# Patient Record
Sex: Female | Born: 1970 | Race: White | Hispanic: No | Smoking: Former smoker
Health system: Southern US, Community
[De-identification: ages and names within clinical notes are randomized; demographics above are authoritative.]

## PROBLEM LIST (undated history)

## (undated) DIAGNOSIS — K219 Gastro-esophageal reflux disease without esophagitis: Secondary | ICD-10-CM

---

## 2001-08-10 ENCOUNTER — Encounter: Payer: Self-pay | Admitting: Obstetrics & Gynecology

## 2001-08-10 ENCOUNTER — Encounter: Admission: RE | Admit: 2001-08-10 | Discharge: 2001-08-10 | Payer: Self-pay | Admitting: Obstetrics & Gynecology

## 2007-11-02 ENCOUNTER — Encounter: Admission: RE | Admit: 2007-11-02 | Discharge: 2007-11-02 | Payer: Self-pay | Admitting: Obstetrics and Gynecology

## 2013-02-22 ENCOUNTER — Other Ambulatory Visit: Payer: Self-pay

## 2013-02-22 DIAGNOSIS — Z1231 Encounter for screening mammogram for malignant neoplasm of breast: Secondary | ICD-10-CM

## 2013-03-24 ENCOUNTER — Ambulatory Visit
Admission: RE | Admit: 2013-03-24 | Discharge: 2013-03-24 | Disposition: A | Discharge status: Home / Self Care | Payer: BC Managed Care – PPO | Source: Ambulatory Visit

## 2013-03-24 DIAGNOSIS — Z1231 Encounter for screening mammogram for malignant neoplasm of breast: Secondary | ICD-10-CM

## 2013-03-27 ENCOUNTER — Other Ambulatory Visit: Payer: Self-pay | Admitting: Obstetrics and Gynecology

## 2013-03-27 DIAGNOSIS — R928 Other abnormal and inconclusive findings on diagnostic imaging of breast: Secondary | ICD-10-CM

## 2013-04-10 ENCOUNTER — Ambulatory Visit
Admission: RE | Admit: 2013-04-10 | Discharge: 2013-04-10 | Disposition: A | Discharge status: Home / Self Care | Payer: BC Managed Care – PPO | Source: Ambulatory Visit | Attending: Obstetrics and Gynecology | Admitting: Obstetrics and Gynecology

## 2013-04-10 DIAGNOSIS — R928 Other abnormal and inconclusive findings on diagnostic imaging of breast: Secondary | ICD-10-CM

## 2013-06-14 ENCOUNTER — Ambulatory Visit (INDEPENDENT_AMBULATORY_CARE_PROVIDER_SITE_OTHER): Payer: BC Managed Care – PPO | Admitting: Family Medicine

## 2013-06-14 VITALS — BP 120/88 | HR 78 | Temp 98.2°F | Resp 18 | Ht 64.0 in | Wt 123.8 lb

## 2013-06-14 DIAGNOSIS — R197 Diarrhea, unspecified: Secondary | ICD-10-CM

## 2013-06-14 LAB — POCT CBC
Granulocyte percent: 51.5 %G (ref 37–80)
HCT, POC: 45 % (ref 37.7–47.9)
MCH, POC: 30.8 pg (ref 27–31.2)
MCV: 96.8 fL (ref 80–97)
MID (cbc): 0.6 (ref 0–0.9)
POC LYMPH PERCENT: 40.5 %L (ref 10–50)
RBC: 4.65 M/uL (ref 4.04–5.48)
WBC: 7.4 10*3/uL (ref 4.6–10.2)

## 2013-06-14 LAB — IFOBT (OCCULT BLOOD): IFOBT: NEGATIVE

## 2013-06-14 MED ORDER — CIPROFLOXACIN HCL 500 MG PO TABS: 500.0000 mg | ORAL_TABLET | Freq: Two times a day (BID) | ORAL | Status: DC

## 2013-06-14 NOTE — Progress Notes (Addendum)
[facility] [address], [place] [address] [phone]  Date:  06/14/2013   Name:  Rachel Peterson   DOB:  January 03, 1971   MRN:  [id number]  PCP:  No PCP Per Patient    Chief Complaint: Diarrhea   History of Present Illness:  Rachel Peterson is a 42 y.o. very pleasant female patient who presents with the following:  She is here with diarrhea- on June 13th/ 14th she was ill with "either food poisoning or a virus."  She had nausea and vomiting (twice) and diarrhea at that time.  She now feels better overall but the diarrhea has persisted.  Her sx will wax and wane.  She had 4 stools today, but 12 on Saturday.  No further vomiting.  She is eating normally and tolerating liquids She did see a orange stool twice, but has not noted any red blood.  The stool is watery, and she will have some loose "powdery" stool as well.  She may have a little bit of cramping prior to a BM, but no other abdominal pains.    Prior to getting sick she ate out.  She had a hamburger which was medium well.  She did not eat much of the burger.   She is generally quite healthy, LMP about 2 weeks ago.   She has not recently been on any abx- she has not taken any abx in several months.   She uses melatonin for sleep.  She does drink some coffee and takes energy pills, but these are not new. She does not take any other medications   There are no active problems to display for this patient.   History reviewed. No pertinent past medical history.  History reviewed. No pertinent past surgical history.  History  Substance Use Topics  . Smoking status: Former Smoker -- 20 years    Types: Cigarettes  . Smokeless tobacco: Not on file  . Alcohol Use: Yes    History reviewed. No pertinent family history.  No Known Allergies  Medication list has been reviewed and updated.  No current outpatient prescriptions on file prior to visit.   No current facility-administered medications on file prior  to visit.    Review of Systems:  As per HPI- otherwise negative.   Physical Examination: Filed Vitals:   06/14/13 1808  BP: 141/99  Pulse: 78  Temp: 98.2 F (36.8 C)  Resp: 18   Filed Vitals:   06/14/13 1808  Height: 5\' 4"  (1.626 m)  Weight: 123 lb 12.8 oz (56.155 kg)   Body mass index is 21.24 kg/(m^2). Ideal Body Weight: Weight in (lb) to have BMI = 25: 145.3  GEN: WDWN, NAD, Non-toxic, A & O x 3, looks well HEENT: Atraumatic, Normocephalic. Neck supple. No masses, No LAD. Ears and Nose: No external deformity. CV: RRR, No M/G/R. No JVD. No thrill. No extra heart sounds. PULM: CTA B, no wheezes, crackles, rhonchi. No retractions. No resp. distress. No accessory muscle use. ABD: S, NT, ND, +BS. No rebound. No HSM. Benign exam EXTR: No c/c/e NEURO Normal gait.  PSYCH: Normally interactive. Conversant. Not depressed or anxious appearing.  Calm demeanor.   Results for orders placed in visit on 06/14/13  POCT CBC      Result Value Range   WBC 7.4  4.6 - 10.2 K/uL   Lymph, poc 3.0  0.6 - 3.4   POC LYMPH PERCENT 40.5  10 - 50 %L   MID (cbc)  0.6  0 - 0.9   POC MID % 8.0  0 - 12 %M   POC Granulocyte 3.8  2 - 6.9   Granulocyte percent 51.5  37 - 80 %G   RBC 4.65  4.04 - 5.48 M/uL   Hemoglobin 14.3  12.2 - 16.2 g/dL   HCT, POC 16.1  09.6 - 47.9 %   MCV 96.8  80 - 97 fL   MCH, POC 30.8  27 - 31.2 pg   MCHC 31.8  31.8 - 35.4 g/dL   RDW, POC 04.5     Platelet Count, POC 367  142 - 424 K/uL   MPV 10.0  0 - 99.8 fL  IFOBT (OCCULT BLOOD)      Result Value Range   IFOBT Negative      Assessment and Plan: Diarrhea - Plan: Ova and parasite screen, Stool culture, POCT CBC, Comprehensive metabolic panel, IFOBT POC (occult bld, rslt in office), ciprofloxacin (CIPRO) 500 MG tablet  Persistent diarrhea.  Suspect she may have had a food borne illness.  Will treat with cipro for 3- 5 days depending on her symptoms. Await other labs as above, and she will collect a stool sample for  analysis.   Signed Abbe Amsterdam, MD  7/4- called to check on her CMP results normal  Results for orders placed in visit on 06/14/13  COMPREHENSIVE METABOLIC PANEL      Result Value Range   Sodium 139  135 - 145 mEq/L   Potassium 3.9  3.5 - 5.3 mEq/L   Chloride 106  96 - 112 mEq/L   CO2 21  19 - 32 mEq/L   Glucose, Bld 98  70 - 99 mg/dL   BUN 13  6 - 23 mg/dL   Creat 4.09  8.11 - 9.14 mg/dL   Total Bilirubin 0.9  0.3 - 1.2 mg/dL   Alkaline Phosphatase 56  39 - 117 U/L   AST 13  0 - 37 U/L   ALT 13  0 - 35 U/L   Total Protein 6.9  6.0 - 8.3 g/dL   Albumin 4.5  3.5 - 5.2 g/dL   Calcium 9.5  8.4 - 78.2 mg/dL  POCT CBC      Result Value Range   WBC 7.4  4.6 - 10.2 K/uL   Lymph, poc 3.0  0.6 - 3.4   POC LYMPH PERCENT 40.5  10 - 50 %L   MID (cbc) 0.6  0 - 0.9   POC MID % 8.0  0 - 12 %M   POC Granulocyte 3.8  2 - 6.9   Granulocyte percent 51.5  37 - 80 %G   RBC 4.65  4.04 - 5.48 M/uL   Hemoglobin 14.3  12.2 - 16.2 g/dL   HCT, POC 95.6  21.3 - 47.9 %   MCV 96.8  80 - 97 fL   MCH, POC 30.8  27 - 31.2 pg   MCHC 31.8  31.8 - 35.4 g/dL   RDW, POC 08.6     Platelet Count, POC 367  142 - 424 K/uL   MPV 10.0  0 - 99.8 fL  IFOBT (OCCULT BLOOD)      Result Value Range   IFOBT Negative     She has actually not picked up the cipro yet.  Her diarrhea is a little better but still there.  She will plan to start the cipro in the next day or so.  She has dropped off the stool sample and  I will look out for this result.    7/7- received stool culture.  Called to let her know it was negative, and stool O and P negative as well.  She will let me know if not better after the cipro, she just started it today

## 2013-06-14 NOTE — Patient Instructions (Addendum)
Let me know if you are not better in the next few days- sooner if you are getting worse!  I will be in touch with your labs as soon as they come in.

## 2013-06-15 LAB — COMPREHENSIVE METABOLIC PANEL
Albumin: 4.5 g/dL (ref 3.5–5.2)
BUN: 13 mg/dL (ref 6–23)
Calcium: 9.5 mg/dL (ref 8.4–10.5)
Chloride: 106 mEq/L (ref 96–112)
Creat: 0.8 mg/dL (ref 0.50–1.10)
Glucose, Bld: 98 mg/dL (ref 70–99)
Potassium: 3.9 mEq/L (ref 3.5–5.3)

## 2013-06-19 LAB — STOOL CULTURE

## 2013-07-14 ENCOUNTER — Telehealth: Payer: Self-pay

## 2013-07-14 DIAGNOSIS — R197 Diarrhea, unspecified: Secondary | ICD-10-CM

## 2013-07-14 NOTE — Telephone Encounter (Signed)
Patient calling to speak to Dr. Patsy Lager, she wanted to ask her  what's the next step because she is still having issues with diarrhea.   Best number is: [phone]

## 2013-07-16 NOTE — Telephone Encounter (Signed)
Called her back- no answer but LMOM.  If severe diarrhea persists please come back. I will refer her to GI for further evaluation otherwise.

## 2013-09-07 ENCOUNTER — Encounter: Payer: Self-pay | Admitting: Family Medicine

## 2014-01-01 ENCOUNTER — Ambulatory Visit (INDEPENDENT_AMBULATORY_CARE_PROVIDER_SITE_OTHER): Payer: Commercial Managed Care - PPO | Admitting: Emergency Medicine

## 2014-01-01 VITALS — BP 135/90 | HR 63 | Temp 98.4°F | Resp 16 | Ht 63.0 in | Wt 119.0 lb

## 2014-01-01 DIAGNOSIS — S0180XA Unspecified open wound of other part of head, initial encounter: Secondary | ICD-10-CM

## 2014-01-01 DIAGNOSIS — S01131A Puncture wound without foreign body of right eyelid and periocular area, initial encounter: Secondary | ICD-10-CM

## 2014-01-01 NOTE — Progress Notes (Signed)
   Subjective:    Patient ID: Rachel BeechamJulie S Peterson, female    DOB: 04/09/1971, 43 y.o.   MRN: [id number]  HPI Patient is here because she poked herself with an old antique apple peeler on her right eyebrow No pain no swelling no redness Doesn't know when she had her last tetanus    Review of Systems     Objective:   Physical Exam There is a tiny 2 mm puncture area mid right brow the eye itself is normal to exam pupils are equal and reactive. There is no evidence of any injury to the eye itself.       Assessment & Plan:  Patient here with a puncture wound to the right eyebrow. Tdap will be given.

## 2014-05-21 ENCOUNTER — Other Ambulatory Visit: Payer: Self-pay

## 2014-05-21 DIAGNOSIS — Z1231 Encounter for screening mammogram for malignant neoplasm of breast: Secondary | ICD-10-CM

## 2014-06-04 ENCOUNTER — Ambulatory Visit
Admission: RE | Admit: 2014-06-04 | Discharge: 2014-06-04 | Disposition: A | Discharge status: Home / Self Care | Payer: BC Managed Care – PPO | Source: Ambulatory Visit

## 2014-06-04 ENCOUNTER — Encounter (INDEPENDENT_AMBULATORY_CARE_PROVIDER_SITE_OTHER): Payer: Self-pay

## 2014-06-04 DIAGNOSIS — Z1231 Encounter for screening mammogram for malignant neoplasm of breast: Secondary | ICD-10-CM

## 2014-07-04 ENCOUNTER — Ambulatory Visit (INDEPENDENT_AMBULATORY_CARE_PROVIDER_SITE_OTHER): Payer: Commercial Managed Care - PPO

## 2014-07-04 ENCOUNTER — Ambulatory Visit (INDEPENDENT_AMBULATORY_CARE_PROVIDER_SITE_OTHER): Payer: Commercial Managed Care - PPO | Admitting: Family Medicine

## 2014-07-04 VITALS — BP 116/74 | HR 88 | Temp 97.6°F | Resp 16 | Ht 63.0 in | Wt 118.4 lb

## 2014-07-04 DIAGNOSIS — R109 Unspecified abdominal pain: Secondary | ICD-10-CM

## 2014-07-04 LAB — POCT CBC
GRANULOCYTE PERCENT: 74.7 % (ref 37–80)
HCT, POC: 48 % — AB (ref 37.7–47.9)
Hemoglobin: 15.8 g/dL (ref 12.2–16.2)
Lymph, poc: 1.9 (ref 0.6–3.4)
MCH, POC: 30.6 pg (ref 27–31.2)
MCHC: 32.8 g/dL (ref 31.8–35.4)
MCV: 93.3 fL (ref 80–97)
MID (CBC): 0.3 (ref 0–0.9)
MPV: 7.7 fL (ref 0–99.8)
POC GRANULOCYTE: 6.5 (ref 2–6.9)
POC LYMPH PERCENT: 22.1 %L (ref 10–50)
POC MID %: 3.2 % (ref 0–12)
Platelet Count, POC: 315 10*3/uL (ref 142–424)
RBC: 5.15 M/uL (ref 4.04–5.48)
RDW, POC: 13.4 %
WBC: 8.7 10*3/uL (ref 4.6–10.2)

## 2014-07-04 LAB — COMPREHENSIVE METABOLIC PANEL
ALT: 13 U/L (ref 0–35)
AST: 14 U/L (ref 0–37)
Albumin: 4.4 g/dL (ref 3.5–5.2)
Alkaline Phosphatase: 69 U/L (ref 39–117)
BILIRUBIN TOTAL: 1 mg/dL (ref 0.2–1.2)
BUN: 11 mg/dL (ref 6–23)
CO2: 29 meq/L (ref 19–32)
Calcium: 9.4 mg/dL (ref 8.4–10.5)
Chloride: 103 mEq/L (ref 96–112)
Creat: 0.98 mg/dL (ref 0.50–1.10)
GLUCOSE: 85 mg/dL (ref 70–99)
Potassium: 4.2 mEq/L (ref 3.5–5.3)
SODIUM: 142 meq/L (ref 135–145)
TOTAL PROTEIN: 7.2 g/dL (ref 6.0–8.3)

## 2014-07-04 LAB — POCT URINE PREGNANCY: PREG TEST UR: NEGATIVE

## 2014-07-04 NOTE — Progress Notes (Addendum)
[facility] [address] [place] [address] [phone]- 0000  Date:  07/04/2014   Name:  Rachel Peterson   DOB:  [birth year]   MRN:  [id number]  PCP:  No PCP Per Patient    Chief Complaint: Abdominal Pain   History of Present Illness:  Rachel Peterson is a 43 y.o. very pleasant female patient who presents with the following:  Yesterday around noon she noted abdominal pain.  She felt like she needed to pass gas but could not. She used some milk of magnesia- she did have a few stools and felt better.  Then she ate a salad for dinner, the pain returned around 11pm last night.  She had pain for most of the night and did not sleep well Right now she feels ok, her stomach is not 100%  No nausea, no vomiting.  She did have 2 stools this am. No fever, body aches.    She had diarrhea for a few weeks last year- she was referred to GI but her sx went away so she did not go.  She wonders if she might have IBS- she tends to have mild constipation and will feel better after she has a BM.  She may use milk of mag once a week or so  LMP at the start of the month.   She ate oatmeal this am- so far she is feeling ok after eating.   There are no active problems to display for this patient.   History reviewed. No pertinent past medical history.  History reviewed. No pertinent past surgical history.  History  Substance Use Topics  . Smoking status: Former Smoker -- 20 years    Types: Cigarettes  . Smokeless tobacco: Not on file  . Alcohol Use: Yes    Family History  Problem Relation Age of Onset  . Diabetes Father   . Hypertension Father     No Known Allergies  Medication list has been reviewed and updated.  No current outpatient prescriptions on file prior to visit.   No current facility-administered medications on file prior to visit.    Review of Systems:  As per HPI- otherwise negative.   Physical Examination: Filed Vitals:   07/04/14 1138  BP: 116/74   Pulse: 88  Temp: 97.6 F (36.4 C)  Resp: 16   Filed Vitals:   07/04/14 1138  Height: 5\' 3"  (1.6 m)  Weight: 118 lb 6.4 oz (53.706 kg)   Body mass index is 20.98 kg/(m^2). Ideal Body Weight: Weight in (lb) to have BMI = 25: 140.8  GEN: WDWN, NAD, Non-toxic, A & O x 3 HEENT: Atraumatic, Normocephalic. Neck supple. No masses, No LAD. Ears and Nose: No external deformity. CV: RRR, No M/G/R. No JVD. No thrill. No extra heart sounds. PULM: CTA B, no wheezes, crackles, rhonchi. No retractions. No resp. distress. No accessory muscle use. ABD: S, NT, ND, +BS. No rebound. No HSM.  Abdomen is benign EXTR: No c/c/e NEURO Normal gait.  PSYCH: Normally interactive. Conversant. Not depressed or anxious appearing.  Calm demeanor.   Results for orders placed in visit on 07/04/14  POCT CBC      Result Value Ref Range   WBC 8.7  4.6 - 10.2 K/uL   Lymph, poc 1.9  0.6 - 3.4   POC LYMPH PERCENT 22.1  10 - 50 %L   MID (cbc) 0.3  0 - 0.9   POC MID % 3.2  0 - 12 %  M   POC Granulocyte 6.5  2 - 6.9   Granulocyte percent 74.7  37 - 80 %G   RBC 5.15  4.04 - 5.48 M/uL   Hemoglobin 15.8  12.2 - 16.2 g/dL   HCT, POC 16.148.0 (*) 09.637.7 - 47.9 %   MCV 93.3  80 - 97 fL   MCH, POC 30.6  27 - 31.2 pg   MCHC 32.8  31.8 - 35.4 g/dL   RDW, POC 04.513.4     Platelet Count, POC 315  142 - 424 K/uL   MPV 7.7  0 - 99.8 fL  POCT URINE PREGNANCY      Result Value Ref Range   Preg Test, Ur Negative     [facility] reading (PRIMARY) by  Dr. Patsy Lageropland. abd series: non- specific bowel gas pattern.  Some increased stool and gas  ABDOMEN - 2 VIEW  COMPARISON: None.  FINDINGS: Normal bowel gas pattern. No dilated loops of large or small bowel. No pathologic air-fluid levels. No free air is identified.  IMPRESSION: Negative.  Assessment and Plan: Abdominal pain, unspecified site - Plan: POCT CBC, Comprehensive metabolic panel, POCT urine pregnancy, DG Abd 2 Views  Abdominal pain yesterday which is now resolved.  Discussed a  CT scan- as her sx are currently quiet she would like to defer any further testing.   Await labs, bland diet.  She will seek care if severe pain comes back   Signed Abbe AmsterdamJessica Tymara Saur, MD  7/23: Called to go over her labs Results for orders placed in visit on 07/04/14  COMPREHENSIVE METABOLIC PANEL      Result Value Ref Range   Sodium 142  135 - 145 mEq/L   Potassium 4.2  3.5 - 5.3 mEq/L   Chloride 103  96 - 112 mEq/L   CO2 29  19 - 32 mEq/L   Glucose, Bld 85  70 - 99 mg/dL   BUN 11  6 - 23 mg/dL   Creat 4.090.98  8.110.50 - 9.141.10 mg/dL   Total Bilirubin 1.0  0.2 - 1.2 mg/dL   Alkaline Phosphatase 69  39 - 117 U/L   AST 14  0 - 37 U/L   ALT 13  0 - 35 U/L   Total Protein 7.2  6.0 - 8.3 g/dL   Albumin 4.4  3.5 - 5.2 g/dL   Calcium 9.4  8.4 - 78.210.5 mg/dL  POCT CBC      Result Value Ref Range   WBC 8.7  4.6 - 10.2 K/uL   Lymph, poc 1.9  0.6 - 3.4   POC LYMPH PERCENT 22.1  10 - 50 %L   MID (cbc) 0.3  0 - 0.9   POC MID % 3.2  0 - 12 %M   POC Granulocyte 6.5  2 - 6.9   Granulocyte percent 74.7  37 - 80 %G   RBC 5.15  4.04 - 5.48 M/uL   Hemoglobin 15.8  12.2 - 16.2 g/dL   HCT, POC 95.648.0 (*) 21.337.7 - 47.9 %   MCV 93.3  80 - 97 fL   MCH, POC 30.6  27 - 31.2 pg   MCHC 32.8  31.8 - 35.4 g/dL   RDW, POC 08.613.4     Platelet Count, POC 315  142 - 424 K/uL   MPV 7.7  0 - 99.8 fL  POCT URINE PREGNANCY      Result Value Ref Range   Preg Test, Ur Negative     LMOM- labs are  normal.  Let me know if not continuing to feel well.  Her blood counts are at the upper limit of normal- would be a good time to consider donating blood if she is so inclined

## 2014-07-04 NOTE — Patient Instructions (Signed)
Let me know if your pain returns, and I will be in touch with your labs.

## 2014-07-05 ENCOUNTER — Encounter: Payer: Self-pay | Admitting: Family Medicine

## 2014-07-18 ENCOUNTER — Telehealth: Payer: Self-pay

## 2014-07-18 DIAGNOSIS — K5900 Constipation, unspecified: Secondary | ICD-10-CM

## 2014-07-18 NOTE — Telephone Encounter (Signed)
Patient requesting to speak directly to Dr. Patsy Lageropland to give an update on her condition. Patient requesting to be called back at work at [phone] ext [phone]. Patient states if it is between 1-2pm today/or after 5pm to call her on her cell phone at [phone]. Patient didn't want to give any information to me.

## 2014-07-19 NOTE — Telephone Encounter (Signed)
Called her back- however no answer.  LMOM

## 2014-07-19 NOTE — Telephone Encounter (Signed)
She notes that her gassy feeling has returned.  She is still passing gas and having stools, but is using laxatives to go.  She feels "stopped up like I need to go bad."  Her mom sees Dr. Loreta AveMann- I will refer her to be seen by Dr. Loreta AveMann.  She will seek care if any severe pain

## 2015-05-22 ENCOUNTER — Other Ambulatory Visit: Payer: Self-pay

## 2015-05-22 DIAGNOSIS — Z1231 Encounter for screening mammogram for malignant neoplasm of breast: Secondary | ICD-10-CM

## 2015-06-14 ENCOUNTER — Ambulatory Visit
Admission: RE | Admit: 2015-06-14 | Discharge: 2015-06-14 | Disposition: A | Discharge status: Home / Self Care | Payer: Commercial Managed Care - PPO | Source: Ambulatory Visit

## 2015-06-14 DIAGNOSIS — Z1231 Encounter for screening mammogram for malignant neoplasm of breast: Secondary | ICD-10-CM

## 2016-07-28 ENCOUNTER — Other Ambulatory Visit: Payer: Self-pay | Admitting: Obstetrics and Gynecology

## 2016-07-28 DIAGNOSIS — Z1231 Encounter for screening mammogram for malignant neoplasm of breast: Secondary | ICD-10-CM

## 2016-09-02 ENCOUNTER — Ambulatory Visit
Admission: RE | Admit: 2016-09-02 | Discharge: 2016-09-02 | Disposition: A | Discharge status: Home / Self Care | Payer: Commercial Managed Care - PPO | Source: Ambulatory Visit | Attending: Obstetrics and Gynecology | Admitting: Obstetrics and Gynecology

## 2016-09-02 DIAGNOSIS — Z1231 Encounter for screening mammogram for malignant neoplasm of breast: Secondary | ICD-10-CM

## 2016-09-07 ENCOUNTER — Other Ambulatory Visit: Payer: Self-pay | Admitting: Obstetrics and Gynecology

## 2016-09-07 DIAGNOSIS — R928 Other abnormal and inconclusive findings on diagnostic imaging of breast: Secondary | ICD-10-CM

## 2016-09-17 ENCOUNTER — Ambulatory Visit
Admission: RE | Admit: 2016-09-17 | Discharge: 2016-09-17 | Disposition: A | Discharge status: Home / Self Care | Payer: Commercial Managed Care - PPO | Source: Ambulatory Visit | Attending: Obstetrics and Gynecology | Admitting: Obstetrics and Gynecology

## 2016-09-17 DIAGNOSIS — R928 Other abnormal and inconclusive findings on diagnostic imaging of breast: Secondary | ICD-10-CM

## 2017-11-08 ENCOUNTER — Other Ambulatory Visit: Payer: Self-pay | Admitting: Obstetrics and Gynecology

## 2017-11-08 DIAGNOSIS — Z1231 Encounter for screening mammogram for malignant neoplasm of breast: Secondary | ICD-10-CM

## 2017-12-03 ENCOUNTER — Ambulatory Visit
Admission: RE | Admit: 2017-12-03 | Discharge: 2017-12-03 | Disposition: A | Discharge status: Home / Self Care | Payer: Commercial Managed Care - PPO | Source: Ambulatory Visit | Attending: Obstetrics and Gynecology | Admitting: Obstetrics and Gynecology

## 2017-12-03 DIAGNOSIS — Z1231 Encounter for screening mammogram for malignant neoplasm of breast: Secondary | ICD-10-CM

## 2017-12-08 ENCOUNTER — Ambulatory Visit: Payer: Commercial Managed Care - PPO

## 2019-01-17 ENCOUNTER — Other Ambulatory Visit: Payer: Self-pay | Admitting: Obstetrics and Gynecology

## 2019-01-17 DIAGNOSIS — Z1231 Encounter for screening mammogram for malignant neoplasm of breast: Secondary | ICD-10-CM

## 2019-02-13 ENCOUNTER — Ambulatory Visit
Admission: RE | Admit: 2019-02-13 | Discharge: 2019-02-13 | Disposition: A | Discharge status: Home / Self Care | Payer: Commercial Managed Care - PPO | Source: Ambulatory Visit | Attending: Obstetrics and Gynecology | Admitting: Obstetrics and Gynecology

## 2019-02-13 DIAGNOSIS — Z1231 Encounter for screening mammogram for malignant neoplasm of breast: Secondary | ICD-10-CM

## 2020-05-08 ENCOUNTER — Ambulatory Visit: Payer: Commercial Managed Care - PPO | Attending: Internal Medicine

## 2020-05-08 DIAGNOSIS — Z23 Encounter for immunization: Secondary | ICD-10-CM

## 2020-05-08 NOTE — Progress Notes (Signed)
   Covid-19 Vaccination Clinic  Name:  PEARLY BARTOSIK    MRN: [id number] DOB: 10-09-1971  05/08/2020  Ms. Remmel was observed post Covid-19 immunization for 15 minutes without incident. She was provided with Vaccine Information Sheet and instruction to access the V-Safe system.   Ms. Manni was instructed to call 911 with any severe reactions post vaccine: Marland Kitchen Difficulty breathing  . Swelling of face and throat  . A fast heartbeat  . A bad rash all over body  . Dizziness and weakness   Immunizations Administered    Name Date Dose VIS Date Route   JANSSEN COVID-19 VACCINE 05/08/2020  1:21 PM 0.5 mL 02/10/2020 Intramuscular   Manufacturer: [employer]   Lot: [id number]   NDC: 24199-144-45

## 2021-10-24 ENCOUNTER — Other Ambulatory Visit: Payer: Self-pay | Admitting: Obstetrics and Gynecology

## 2021-10-24 DIAGNOSIS — Z1231 Encounter for screening mammogram for malignant neoplasm of breast: Secondary | ICD-10-CM

## 2021-11-27 ENCOUNTER — Ambulatory Visit
Admission: RE | Admit: 2021-11-27 | Discharge: 2021-11-27 | Disposition: A | Discharge status: Home / Self Care | Payer: Commercial Managed Care - PPO | Source: Ambulatory Visit | Attending: Obstetrics and Gynecology | Admitting: Obstetrics and Gynecology

## 2021-11-27 DIAGNOSIS — Z1231 Encounter for screening mammogram for malignant neoplasm of breast: Secondary | ICD-10-CM

## 2022-06-22 IMAGING — MG MM DIGITAL SCREENING BILAT W/ TOMO AND CAD
8 series · 9 of 24 positions shown · non-contrast
Comparison: Previous exam(s).

CLINICAL DATA: Screening.

EXAM:
DIGITAL SCREENING BILATERAL MAMMOGRAM WITH TOMOSYNTHESIS
TECHNIQUE: Bilateral screening digital craniocaudal and mediolateral oblique
mammograms were obtained. Bilateral screening digital breast
tomosynthesis was performed. The images were evaluated with
computer-aided detection.

[R MLO synth-2D]
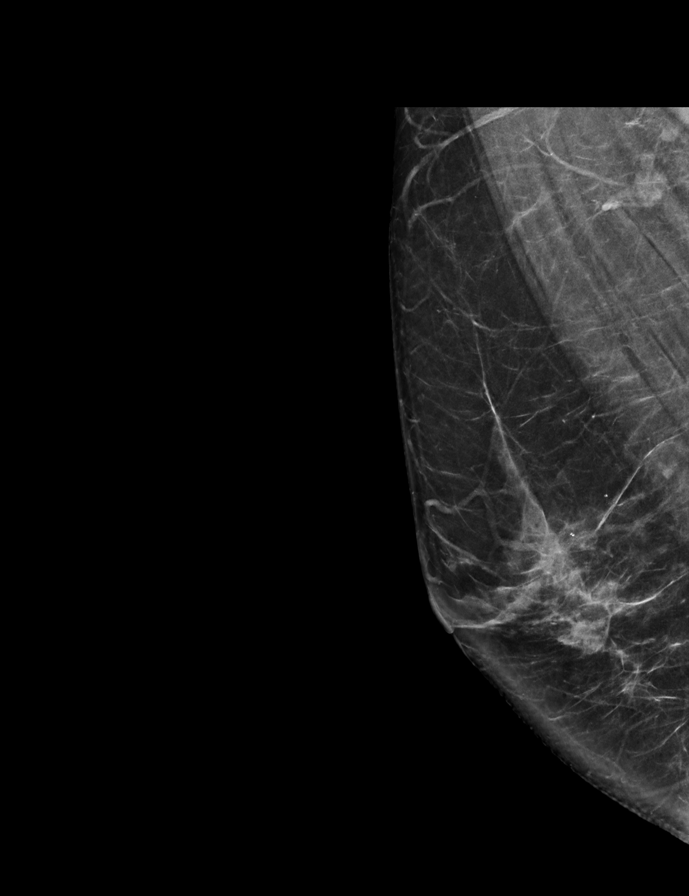

[L MLO synth-2D]
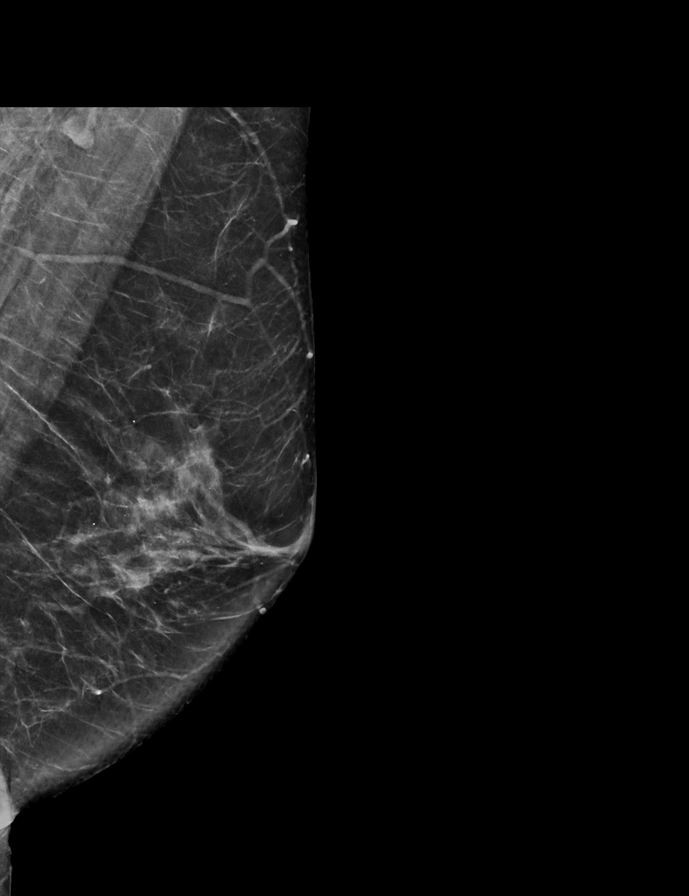

[R CC synth-2D]
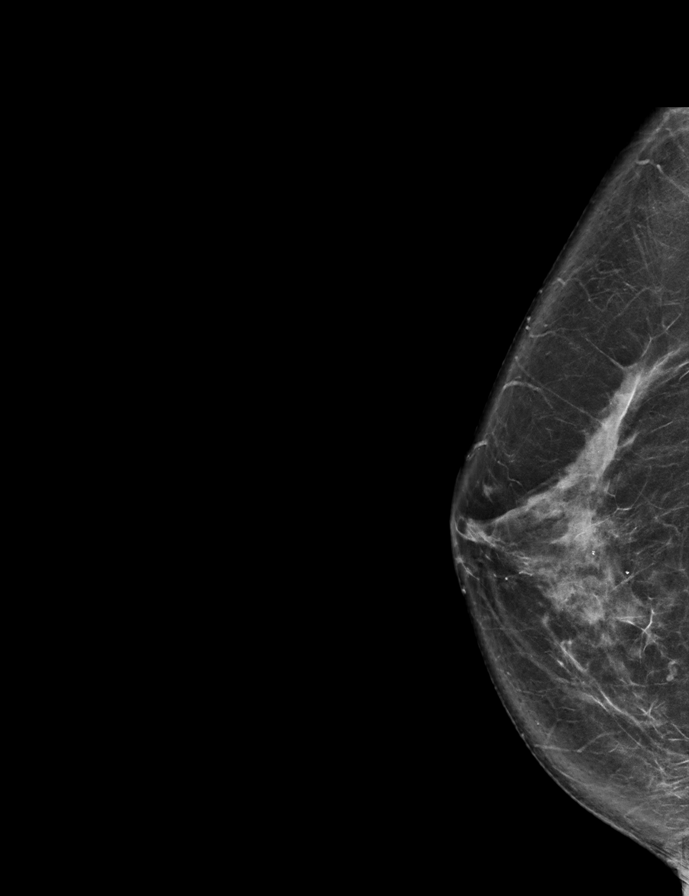

[L CC synth-2D]
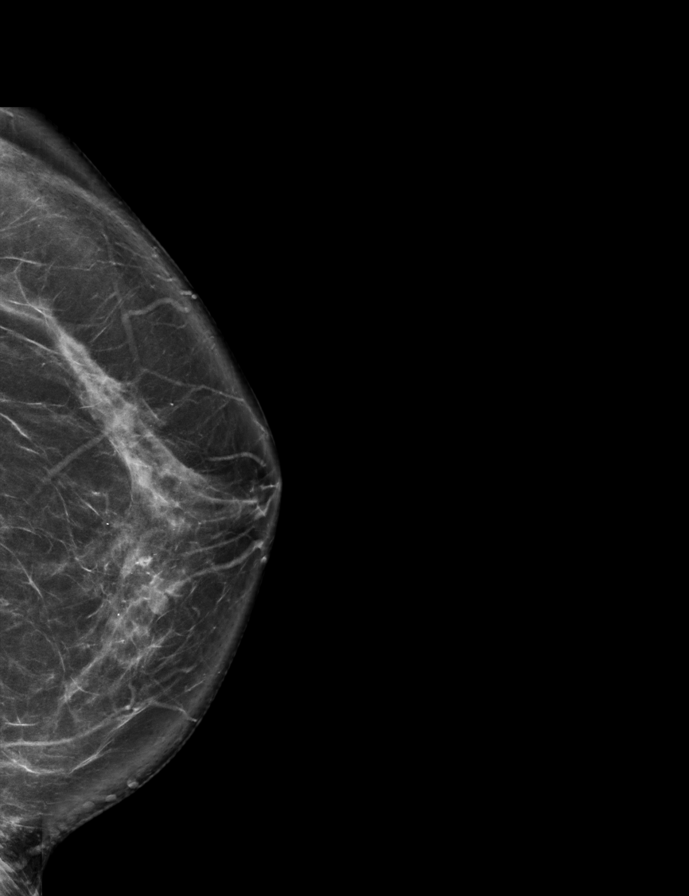

[L CC tomo · 2 of 77 frames shown]
[frame 25/77]
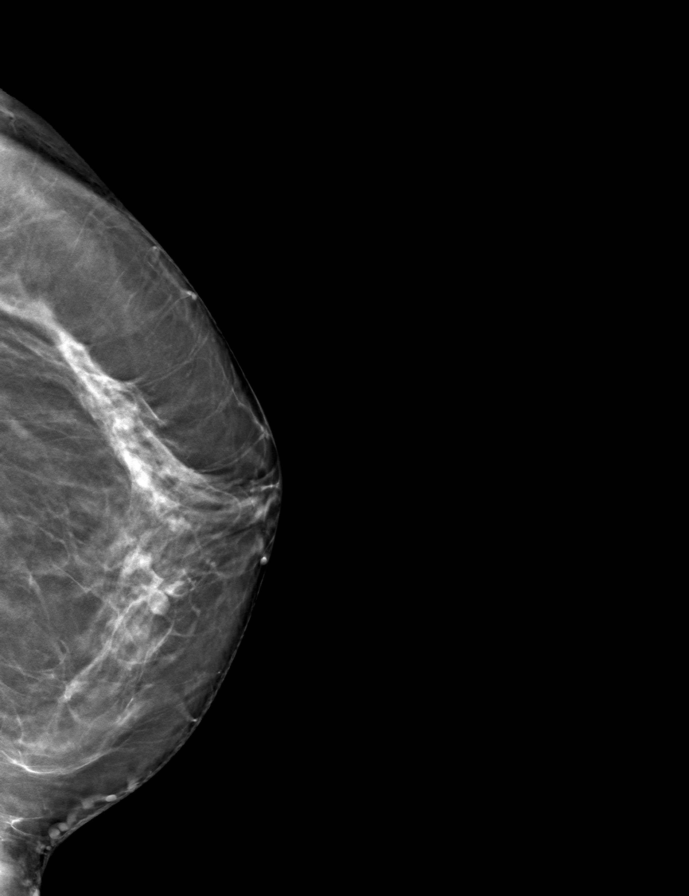
[frame 39/77]
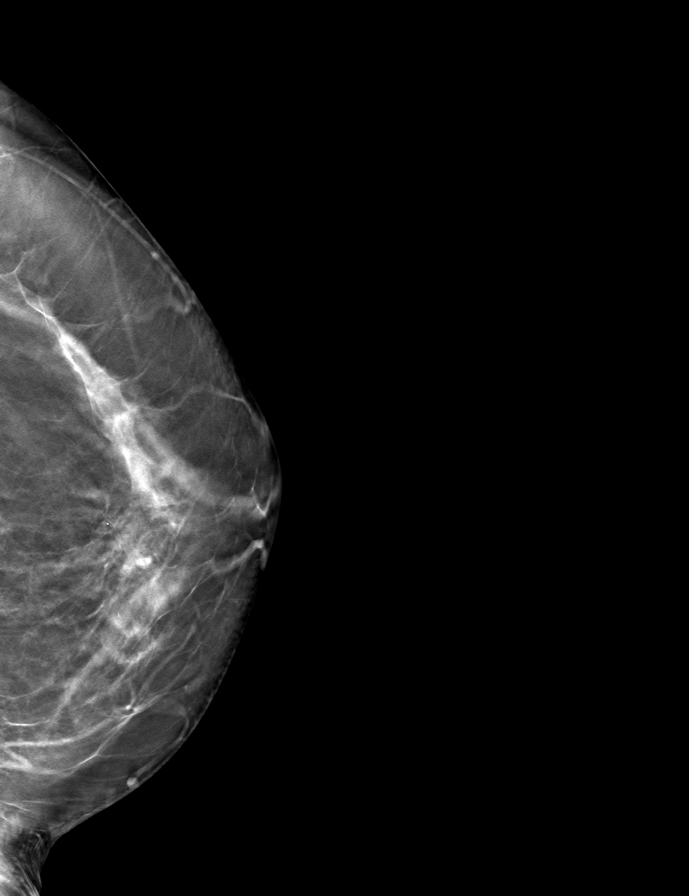

[R MLO tomo · tomo slice 37/73.0]
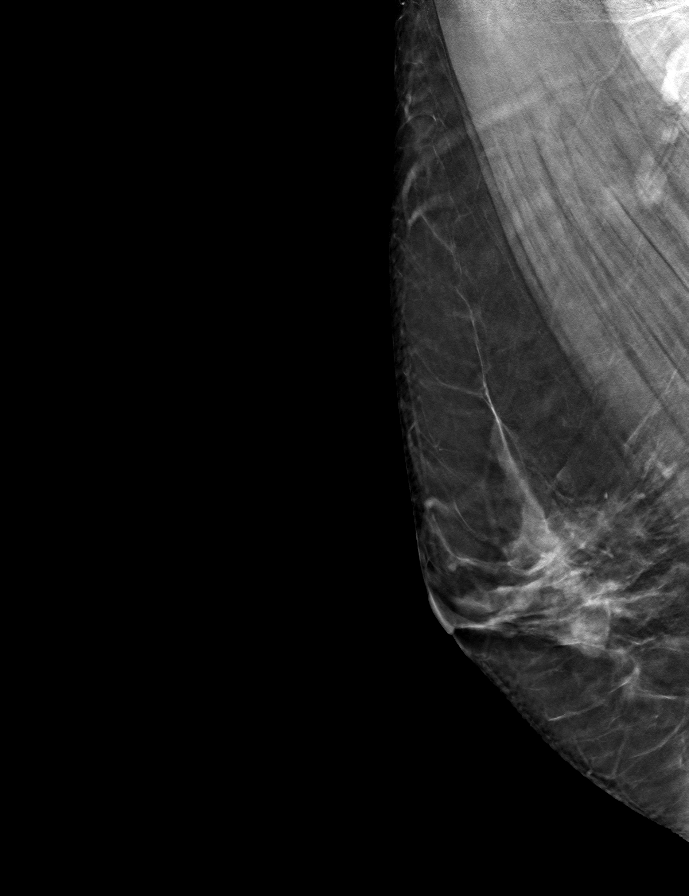

[L MLO tomo · tomo slice 39/77.0]
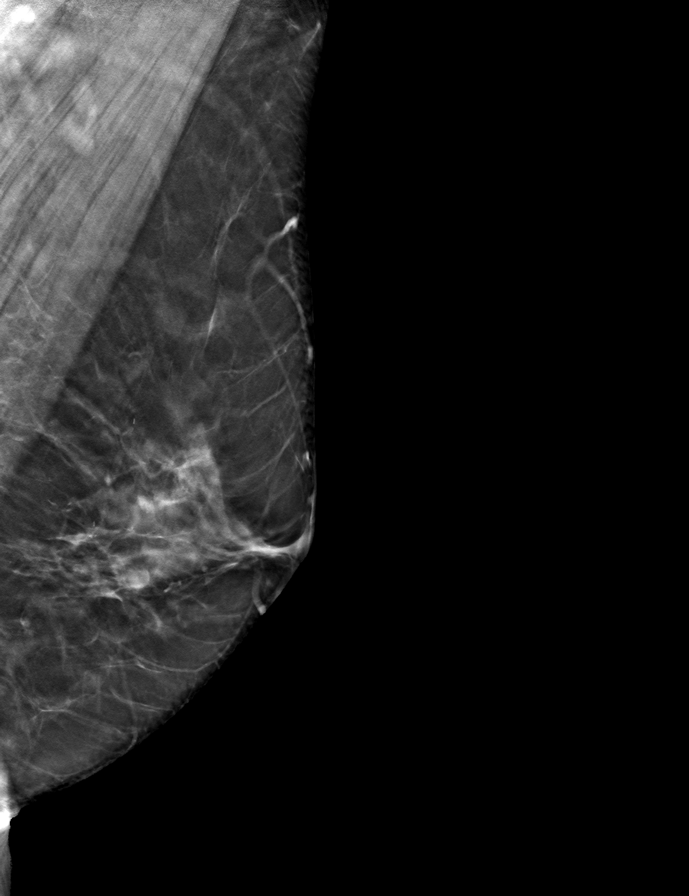

[R CC tomo · tomo slice 39/76.0]
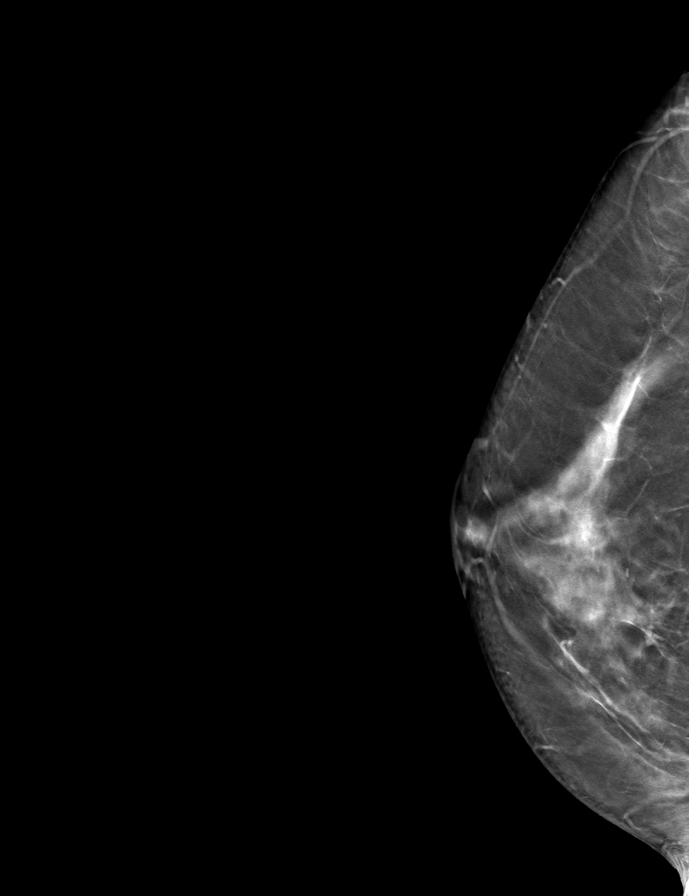

[9 of 24 positions shown; findings below may reference images not displayed]

ACR Breast Density Category c: The breast tissue is heterogeneously
dense, which may obscure small masses.
FINDINGS: There are no findings suspicious for malignancy.
IMPRESSION: No mammographic evidence of malignancy. A result letter of this
screening mammogram will be mailed directly to the patient.

RECOMMENDATION:
Screening mammogram in one year. (Code:[id number]-948)

BI-RADS CATEGORY  1: Negative.

## 2023-04-13 ENCOUNTER — Other Ambulatory Visit: Payer: Self-pay | Admitting: Obstetrics and Gynecology

## 2023-04-13 DIAGNOSIS — Z1231 Encounter for screening mammogram for malignant neoplasm of breast: Secondary | ICD-10-CM

## 2023-05-18 ENCOUNTER — Ambulatory Visit: Payer: Commercial Managed Care - PPO

## 2023-05-19 ENCOUNTER — Ambulatory Visit
Admission: RE | Admit: 2023-05-19 | Discharge: 2023-05-19 | Disposition: A | Discharge status: Home / Self Care | Payer: Commercial Managed Care - PPO | Source: Ambulatory Visit | Attending: Obstetrics and Gynecology | Admitting: Obstetrics and Gynecology

## 2023-05-19 DIAGNOSIS — Z1231 Encounter for screening mammogram for malignant neoplasm of breast: Secondary | ICD-10-CM

## 2023-05-20 ENCOUNTER — Other Ambulatory Visit: Payer: Self-pay | Admitting: Obstetrics and Gynecology

## 2023-05-20 DIAGNOSIS — R928 Other abnormal and inconclusive findings on diagnostic imaging of breast: Secondary | ICD-10-CM

## 2023-06-01 ENCOUNTER — Ambulatory Visit
Admission: RE | Admit: 2023-06-01 | Discharge: 2023-06-01 | Disposition: A | Discharge status: Home / Self Care | Payer: Commercial Managed Care - PPO | Source: Ambulatory Visit | Attending: Obstetrics and Gynecology | Admitting: Obstetrics and Gynecology

## 2023-06-01 DIAGNOSIS — R928 Other abnormal and inconclusive findings on diagnostic imaging of breast: Secondary | ICD-10-CM

## 2023-06-02 ENCOUNTER — Other Ambulatory Visit: Payer: Self-pay | Admitting: Obstetrics and Gynecology

## 2023-06-02 DIAGNOSIS — N6310 Unspecified lump in the right breast, unspecified quadrant: Secondary | ICD-10-CM

## 2023-06-08 ENCOUNTER — Ambulatory Visit
Admission: RE | Admit: 2023-06-08 | Discharge: 2023-06-08 | Disposition: A | Discharge status: Home / Self Care | Payer: Commercial Managed Care - PPO | Source: Ambulatory Visit | Attending: Obstetrics and Gynecology | Admitting: Obstetrics and Gynecology

## 2023-06-08 DIAGNOSIS — N6310 Unspecified lump in the right breast, unspecified quadrant: Secondary | ICD-10-CM

## 2023-06-08 HISTORY — PX: BREAST BIOPSY: SHX20

## 2023-06-09 ENCOUNTER — Other Ambulatory Visit: Payer: Self-pay | Admitting: Obstetrics and Gynecology

## 2023-06-09 DIAGNOSIS — N6310 Unspecified lump in the right breast, unspecified quadrant: Secondary | ICD-10-CM

## 2023-06-10 ENCOUNTER — Other Ambulatory Visit: Payer: Self-pay | Admitting: Obstetrics and Gynecology

## 2023-06-10 DIAGNOSIS — N6310 Unspecified lump in the right breast, unspecified quadrant: Secondary | ICD-10-CM

## 2023-09-10 ENCOUNTER — Ambulatory Visit
Admission: RE | Admit: 2023-09-10 | Discharge: 2023-09-10 | Disposition: A | Discharge status: Home / Self Care | Payer: Commercial Managed Care - PPO | Source: Ambulatory Visit | Attending: Obstetrics and Gynecology | Admitting: Obstetrics and Gynecology

## 2023-09-10 DIAGNOSIS — N6310 Unspecified lump in the right breast, unspecified quadrant: Secondary | ICD-10-CM

## 2023-09-27 ENCOUNTER — Other Ambulatory Visit: Payer: Self-pay | Admitting: Surgery

## 2023-09-27 DIAGNOSIS — N6312 Unspecified lump in the right breast, upper inner quadrant: Secondary | ICD-10-CM

## 2023-09-29 ENCOUNTER — Other Ambulatory Visit: Payer: Self-pay | Admitting: Surgery

## 2023-09-29 DIAGNOSIS — N6312 Unspecified lump in the right breast, upper inner quadrant: Secondary | ICD-10-CM

## 2023-10-14 ENCOUNTER — Other Ambulatory Visit: Payer: Self-pay

## 2023-10-14 ENCOUNTER — Encounter (HOSPITAL_BASED_OUTPATIENT_CLINIC_OR_DEPARTMENT_OTHER): Payer: Self-pay | Admitting: *Deleted

## 2023-10-15 MED ORDER — CHLORHEXIDINE GLUCONATE CLOTH 2 % EX PADS: 6.0000 | MEDICATED_PAD | Freq: Once | CUTANEOUS | Status: DC

## 2023-10-15 MED ORDER — ENSURE PRE-SURGERY PO LIQD: 296.0000 mL | Freq: Once | ORAL | Status: DC

## 2023-10-15 NOTE — Progress Notes (Signed)

## 2023-10-20 ENCOUNTER — Ambulatory Visit
Admission: RE | Admit: 2023-10-20 | Discharge: 2023-10-20 | Disposition: A | Discharge status: Home / Self Care | Payer: Commercial Managed Care - PPO | Source: Ambulatory Visit | Attending: Surgery | Admitting: Surgery

## 2023-10-20 ENCOUNTER — Other Ambulatory Visit: Payer: Self-pay | Admitting: Surgery

## 2023-10-20 DIAGNOSIS — N6312 Unspecified lump in the right breast, upper inner quadrant: Secondary | ICD-10-CM

## 2023-10-20 HISTORY — PX: BREAST BIOPSY: SHX20

## 2023-10-20 NOTE — Anesthesia Preprocedure Evaluation (Signed)
Anesthesia Evaluation  Patient identified by MRN, date of birth, ID band Patient awake    Reviewed: Allergy & Precautions, NPO status , Patient's Chart, lab work & pertinent test results  Airway Mallampati: I  TM Distance: >3 FB Neck ROM: Full    Dental no notable dental hx. (+) Teeth Intact, Dental Advisory Given   Pulmonary former smoker   Pulmonary exam normal breath sounds clear to auscultation       Cardiovascular negative cardio ROS Normal cardiovascular exam Rhythm:Regular Rate:Normal     Neuro/Psych  negative psych ROS   GI/Hepatic Neg liver ROS,GERD  ,,  Endo/Other  negative endocrine ROS    Renal/GU      Musculoskeletal negative musculoskeletal ROS (+)    Abdominal   Peds  Hematology   Anesthesia Other Findings R Breast MAss  Reproductive/Obstetrics                             Anesthesia Physical Anesthesia Plan  ASA: 2  Anesthesia Plan: General   Post-op Pain Management: Precedex and Tylenol PO (pre-op)*   Induction: Intravenous  PONV Risk Score and Plan: Propofol infusion, TIVA, Treatment may vary due to age or medical condition, Midazolam and Ondansetron  Airway Management Planned: LMA  Additional Equipment: None  Intra-op Plan:   Post-operative Plan: Extubation in OR  Informed Consent: I have reviewed the patients History and Physical, chart, labs and discussed the procedure including the risks, benefits and alternatives for the proposed anesthesia with the patient or authorized representative who has indicated his/her understanding and acceptance.     Dental advisory given  Plan Discussed with: CRNA  Anesthesia Plan Comments: (LMA TIVA)        Anesthesia Quick Evaluation

## 2023-10-20 NOTE — H&P (Signed)
  REFERRING PHYSICIAN: Edwinna Areola, * PROVIDER: Wayne Both, MD MRN: [id number] DOB: [birth year] DATE OF ENCOUNTER: 09/27/2023 Subjective   Chief Complaint: Right Breast Mass  History of Present Illness: Rachel Peterson is a 52 y.o. female who is seen as an office consultation for evaluation of Right Breast Mass  This is a 53 year old female referred here for a small mass in her right breast. She has dense breast and this was found on screening mammography in June. There was a 2 mm mass in the upper inner quadrant of her left breast. She underwent a biopsy of this that immediately created a hematoma. The biopsy is benign but felt to be discordant. She has had follow-up imaging and shows the mass is now about 3 mm in size. The original biopsy was found to be discordant so surgical removal of this area is recommended. She does have a family history of breast cancer and a paternal half sister. She has had no previous problems regarding her breast. She denies any current nipple discharge.  Review of Systems: A complete review of systems was obtained from the patient. I have reviewed this information and discussed as appropriate with the patient. See HPI as well for other ROS.  ROS   Medical History: History reviewed. No pertinent past medical history.  There is no problem list on file for this patient.  History reviewed. No pertinent surgical history.   No Known Allergies  Current Outpatient Medications on File Prior to Visit  Medication Sig Dispense Refill  omeprazole (PRILOSEC) 40 MG DR capsule Take 40 mg by mouth once daily   No current facility-administered medications on file prior to visit.   Family History  Problem Relation Age of Onset  Obesity Father    Social History   Tobacco Use  Smoking Status Former  Types: Cigarettes  Smokeless Tobacco Never    Social History   Socioeconomic History  Marital status: Married  Tobacco Use  Smoking status:  Former  Types: Cigarettes  Smokeless tobacco: Never  Substance and Sexual Activity  Alcohol use: Yes  Comment: 2x per week  Drug use: Never   Objective:   Vitals:  09/27/23 1534  BP: (!) 142/84  Pulse: 88  Temp: 37 C (98.6 F)  SpO2: 99%  Weight: 57.8 kg (127 lb 6.4 oz)  Height: 160 cm (5\' 3" )  PainSc: 0-No pain   Body mass index is 22.57 kg/m.  Physical Exam   She appears well on exam  There are no palpable breast masses in either breast. The nipple areolar complexes are normal.  There is no axillary adenopathy  Labs, Imaging and Diagnostic Testing: I have reviewed the mammograms, ultrasound, and pathology results  Assessment and Plan:   Right breast mass upper inner quadrant  At this point, the radiologist have reviewed the imaging again and feel that this was a discordant biopsy and recommend to go excision of the right breast mass. I explained the reasonings for this with the patient. I discussed proceeding with a right breast radioactive seed guided lumpectomy. I explained the surgical procedure in detail. We discussed the risks which includes but is not limited to bleeding, infection, injury to surrounding structures, the need for further surgery if malignancy is found, cardiopulmonary issues with anesthesia, postoperative recovery, etc. She understands and wishes to proceed with surgery which will be scheduled

## 2023-10-21 ENCOUNTER — Other Ambulatory Visit: Payer: Self-pay

## 2023-10-21 ENCOUNTER — Encounter (HOSPITAL_BASED_OUTPATIENT_CLINIC_OR_DEPARTMENT_OTHER): Payer: Self-pay | Admitting: Surgery

## 2023-10-21 ENCOUNTER — Ambulatory Visit (HOSPITAL_BASED_OUTPATIENT_CLINIC_OR_DEPARTMENT_OTHER): Payer: Commercial Managed Care - PPO | Admitting: Anesthesiology

## 2023-10-21 ENCOUNTER — Encounter (HOSPITAL_BASED_OUTPATIENT_CLINIC_OR_DEPARTMENT_OTHER)
Admission: RE | Disposition: A | Discharge status: Home / Self Care | Payer: Self-pay | Source: Home / Self Care | Attending: Surgery

## 2023-10-21 ENCOUNTER — Ambulatory Visit
Admission: RE | Admit: 2023-10-21 | Discharge: 2023-10-21 | Disposition: A | Discharge status: Home / Self Care | Payer: Commercial Managed Care - PPO | Source: Ambulatory Visit | Attending: Surgery | Admitting: Surgery

## 2023-10-21 ENCOUNTER — Ambulatory Visit (HOSPITAL_BASED_OUTPATIENT_CLINIC_OR_DEPARTMENT_OTHER)
Admission: RE | Admit: 2023-10-21 | Discharge: 2023-10-21 | Disposition: A | Discharge status: Home / Self Care | Payer: Commercial Managed Care - PPO | Attending: Surgery | Admitting: Surgery

## 2023-10-21 DIAGNOSIS — N6081 Other benign mammary dysplasias of right breast: Secondary | ICD-10-CM | POA: Insufficient documentation

## 2023-10-21 DIAGNOSIS — N6311 Unspecified lump in the right breast, upper outer quadrant: Secondary | ICD-10-CM

## 2023-10-21 DIAGNOSIS — N6041 Mammary duct ectasia of right breast: Secondary | ICD-10-CM | POA: Insufficient documentation

## 2023-10-21 DIAGNOSIS — N6021 Fibroadenosis of right breast: Secondary | ICD-10-CM | POA: Insufficient documentation

## 2023-10-21 DIAGNOSIS — N6489 Other specified disorders of breast: Secondary | ICD-10-CM | POA: Diagnosis not present

## 2023-10-21 DIAGNOSIS — N6312 Unspecified lump in the right breast, upper inner quadrant: Secondary | ICD-10-CM

## 2023-10-21 DIAGNOSIS — K219 Gastro-esophageal reflux disease without esophagitis: Secondary | ICD-10-CM | POA: Diagnosis not present

## 2023-10-21 DIAGNOSIS — Z803 Family history of malignant neoplasm of breast: Secondary | ICD-10-CM | POA: Diagnosis not present

## 2023-10-21 DIAGNOSIS — Z87891 Personal history of nicotine dependence: Secondary | ICD-10-CM | POA: Insufficient documentation

## 2023-10-21 HISTORY — DX: Gastro-esophageal reflux disease without esophagitis: K21.9

## 2023-10-21 HISTORY — PX: BREAST LUMPECTOMY WITH RADIOACTIVE SEED LOCALIZATION: SHX6424

## 2023-10-21 SURGERY — BREAST LUMPECTOMY WITH RADIOACTIVE SEED LOCALIZATION
Anesthesia: General | Site: Breast | Laterality: Right

## 2023-10-21 MED ORDER — AMISULPRIDE (ANTIEMETIC) 5 MG/2ML IV SOLN: 10.0000 mg | Freq: Once | INTRAVENOUS | Status: DC | PRN

## 2023-10-21 MED ORDER — PROPOFOL 10 MG/ML IV BOLUS
INTRAVENOUS | Status: DC | PRN
  Administered 2023-10-21: 150 mg via INTRAVENOUS

## 2023-10-21 MED ORDER — TRAMADOL HCL 50 MG PO TABS: 50.0000 mg | ORAL_TABLET | Freq: Four times a day (QID) | ORAL | 0 refills | Status: AC | PRN

## 2023-10-21 MED ORDER — LIDOCAINE HCL (CARDIAC) PF 100 MG/5ML IV SOSY
PREFILLED_SYRINGE | INTRAVENOUS | Status: DC | PRN
  Administered 2023-10-21: 100 mg via INTRAVENOUS

## 2023-10-21 MED ORDER — ACETAMINOPHEN 500 MG PO TABS
ORAL_TABLET | ORAL | Status: AC
  Filled 2023-10-21: qty 2

## 2023-10-21 MED ORDER — ONDANSETRON HCL 4 MG/2ML IJ SOLN
INTRAMUSCULAR | Status: AC
  Filled 2023-10-21: qty 2

## 2023-10-21 MED ORDER — DEXAMETHASONE SODIUM PHOSPHATE 10 MG/ML IJ SOLN
INTRAMUSCULAR | Status: AC
Start: 2023-10-21 — End: ?
  Filled 2023-10-21: qty 1

## 2023-10-21 MED ORDER — EPHEDRINE 5 MG/ML INJ
INTRAVENOUS | Status: AC
  Filled 2023-10-21: qty 5

## 2023-10-21 MED ORDER — ACETAMINOPHEN 10 MG/ML IV SOLN: 1000.0000 mg | Freq: Once | INTRAVENOUS | Status: DC | PRN

## 2023-10-21 MED ORDER — KETOROLAC TROMETHAMINE 30 MG/ML IJ SOLN
INTRAMUSCULAR | Status: AC
  Filled 2023-10-21: qty 1

## 2023-10-21 MED ORDER — CEFAZOLIN SODIUM-DEXTROSE 2-4 GM/100ML-% IV SOLN
2.0000 g | INTRAVENOUS | Status: AC
  Administered 2023-10-21: 2 g via INTRAVENOUS

## 2023-10-21 MED ORDER — PROPOFOL 10 MG/ML IV BOLUS
INTRAVENOUS | Status: AC
  Filled 2023-10-21: qty 20

## 2023-10-21 MED ORDER — LIDOCAINE 2% (20 MG/ML) 5 ML SYRINGE
INTRAMUSCULAR | Status: AC
  Filled 2023-10-21: qty 5

## 2023-10-21 MED ORDER — MIDAZOLAM HCL 5 MG/5ML IJ SOLN
INTRAMUSCULAR | Status: DC | PRN
  Administered 2023-10-21: 2 mg via INTRAVENOUS

## 2023-10-21 MED ORDER — EPHEDRINE SULFATE (PRESSORS) 50 MG/ML IJ SOLN
INTRAMUSCULAR | Status: DC | PRN
  Administered 2023-10-21: 10 mg via INTRAVENOUS

## 2023-10-21 MED ORDER — SODIUM CHLORIDE 0.9 % IV SOLN: INTRAVENOUS | Status: DC | PRN

## 2023-10-21 MED ORDER — BUPIVACAINE-EPINEPHRINE 0.5% -1:200000 IJ SOLN
INTRAMUSCULAR | Status: DC | PRN
  Administered 2023-10-21: 16 mL

## 2023-10-21 MED ORDER — DEXMEDETOMIDINE HCL IN NACL 80 MCG/20ML IV SOLN
INTRAVENOUS | Status: AC
  Filled 2023-10-21: qty 20

## 2023-10-21 MED ORDER — MIDAZOLAM HCL 2 MG/2ML IJ SOLN
INTRAMUSCULAR | Status: AC
  Filled 2023-10-21: qty 2

## 2023-10-21 MED ORDER — DEXAMETHASONE SODIUM PHOSPHATE 4 MG/ML IJ SOLN
INTRAMUSCULAR | Status: DC | PRN
  Administered 2023-10-21: 8 mg via INTRAVENOUS

## 2023-10-21 MED ORDER — ONDANSETRON HCL 4 MG/2ML IJ SOLN
INTRAMUSCULAR | Status: DC | PRN
  Administered 2023-10-21: 4 mg via INTRAVENOUS

## 2023-10-21 MED ORDER — OXYCODONE HCL 5 MG/5ML PO SOLN: 5.0000 mg | Freq: Once | ORAL | Status: DC | PRN

## 2023-10-21 MED ORDER — ACETAMINOPHEN 500 MG PO TABS
1000.0000 mg | ORAL_TABLET | ORAL | Status: AC
  Administered 2023-10-21: 1000 mg via ORAL

## 2023-10-21 MED ORDER — ONDANSETRON HCL 4 MG/2ML IJ SOLN: 4.0000 mg | Freq: Once | INTRAMUSCULAR | Status: DC | PRN

## 2023-10-21 MED ORDER — FENTANYL CITRATE (PF) 100 MCG/2ML IJ SOLN
INTRAMUSCULAR | Status: AC
  Filled 2023-10-21: qty 2

## 2023-10-21 MED ORDER — LACTATED RINGERS IV SOLN: INTRAVENOUS | Status: DC

## 2023-10-21 MED ORDER — FENTANYL CITRATE (PF) 100 MCG/2ML IJ SOLN
INTRAMUSCULAR | Status: DC | PRN
  Administered 2023-10-21 (×2): 50 ug via INTRAVENOUS

## 2023-10-21 MED ORDER — PROPOFOL 500 MG/50ML IV EMUL
INTRAVENOUS | Status: DC | PRN
Start: 2023-10-21 — End: 2023-10-21
  Administered 2023-10-21: 150 ug/kg/min via INTRAVENOUS

## 2023-10-21 MED ORDER — OXYCODONE HCL 5 MG PO TABS: 5.0000 mg | ORAL_TABLET | Freq: Once | ORAL | Status: DC | PRN

## 2023-10-21 MED ORDER — ACETAMINOPHEN 500 MG PO TABS
1000.0000 mg | ORAL_TABLET | Freq: Once | ORAL | Status: DC
Start: 2023-10-21 — End: 2023-10-21

## 2023-10-21 MED ORDER — HYDROMORPHONE HCL 1 MG/ML IJ SOLN: 0.2500 mg | INTRAMUSCULAR | Status: DC | PRN

## 2023-10-21 MED ORDER — CEFAZOLIN SODIUM-DEXTROSE 2-4 GM/100ML-% IV SOLN
INTRAVENOUS | Status: AC
  Filled 2023-10-21: qty 100

## 2023-10-21 SURGICAL SUPPLY — 47 items
ADH SKN CLS APL DERMABOND .7 (GAUZE/BANDAGES/DRESSINGS) ×1
APL PRP STRL LF DISP 70% ISPRP (MISCELLANEOUS) ×1
APPLIER CLIP 9.375 MED OPEN (MISCELLANEOUS)
APR CLP MED 9.3 20 MLT OPN (MISCELLANEOUS)
BINDER BREAST 3XL (GAUZE/BANDAGES/DRESSINGS) IMPLANT
BINDER BREAST LRG (GAUZE/BANDAGES/DRESSINGS) IMPLANT
BINDER BREAST MEDIUM (GAUZE/BANDAGES/DRESSINGS) IMPLANT
BINDER BREAST XLRG (GAUZE/BANDAGES/DRESSINGS) IMPLANT
BINDER BREAST XXLRG (GAUZE/BANDAGES/DRESSINGS) IMPLANT
BLADE SURG 15 STRL LF DISP TIS (BLADE) ×1 IMPLANT
BLADE SURG 15 STRL SS (BLADE) ×1
CANISTER SUC SOCK COL 7IN (MISCELLANEOUS) IMPLANT
CANISTER SUCT 1200ML W/VALVE (MISCELLANEOUS) IMPLANT
CHLORAPREP W/TINT 26 (MISCELLANEOUS) ×1 IMPLANT
CLIP APPLIE 9.375 MED OPEN (MISCELLANEOUS) IMPLANT
COVER BACK TABLE 60X90IN (DRAPES) ×1 IMPLANT
COVER MAYO STAND STRL (DRAPES) ×1 IMPLANT
COVER PROBE CYLINDRICAL 5X96 (MISCELLANEOUS) ×1 IMPLANT
DERMABOND ADVANCED .7 DNX12 (GAUZE/BANDAGES/DRESSINGS) ×1 IMPLANT
DRAPE LAPAROSCOPIC ABDOMINAL (DRAPES) ×1 IMPLANT
DRAPE UTILITY XL STRL (DRAPES) ×1 IMPLANT
ELECT REM PT RETURN 9FT ADLT (ELECTROSURGICAL) ×1
ELECTRODE REM PT RTRN 9FT ADLT (ELECTROSURGICAL) ×1 IMPLANT
GAUZE SPONGE 4X4 12PLY STRL LF (GAUZE/BANDAGES/DRESSINGS) IMPLANT
GLOVE SURG SIGNA 7.5 PF LTX (GLOVE) ×1 IMPLANT
GOWN STRL REUS W/ TWL LRG LVL3 (GOWN DISPOSABLE) ×1 IMPLANT
GOWN STRL REUS W/ TWL XL LVL3 (GOWN DISPOSABLE) ×1 IMPLANT
GOWN STRL REUS W/TWL LRG LVL3 (GOWN DISPOSABLE) ×1
GOWN STRL REUS W/TWL XL LVL3 (GOWN DISPOSABLE) ×1
KIT MARKER MARGIN INK (KITS) ×1 IMPLANT
NDL HYPO 25X1 1.5 SAFETY (NEEDLE) ×1 IMPLANT
NEEDLE HYPO 25X1 1.5 SAFETY (NEEDLE) ×1
NS IRRIG 1000ML POUR BTL (IV SOLUTION) IMPLANT
PACK BASIN DAY SURGERY FS (CUSTOM PROCEDURE TRAY) ×1 IMPLANT
PENCIL SMOKE EVACUATOR (MISCELLANEOUS) ×1 IMPLANT
SLEEVE SCD COMPRESS KNEE MED (STOCKING) ×1 IMPLANT
SPIKE FLUID TRANSFER (MISCELLANEOUS) IMPLANT
SPONGE T-LAP 4X18 ~~LOC~~+RFID (SPONGE) ×1 IMPLANT
SUT MNCRL AB 4-0 PS2 18 (SUTURE) ×1 IMPLANT
SUT SILK 2 0 SH (SUTURE) IMPLANT
SUT VIC AB 3-0 SH 27 (SUTURE) ×1
SUT VIC AB 3-0 SH 27X BRD (SUTURE) ×1 IMPLANT
SYR CONTROL 10ML LL (SYRINGE) ×1 IMPLANT
TOWEL GREEN STERILE FF (TOWEL DISPOSABLE) ×1 IMPLANT
TRAY FAXITRON CT DISP (TRAY / TRAY PROCEDURE) ×1 IMPLANT
TUBE CONNECTING 20X1/4 (TUBING) IMPLANT
YANKAUER SUCT BULB TIP NO VENT (SUCTIONS) IMPLANT

## 2023-10-21 NOTE — Anesthesia Procedure Notes (Signed)
Procedure Name: LMA Insertion Date/Time: 10/21/2023 9:25 AM  Performed by: Cleda Clarks, CRNAPre-anesthesia Checklist: Patient identified, Emergency Drugs available, Suction available and Patient being monitored Patient Re-evaluated:Patient Re-evaluated prior to induction Oxygen Delivery Method: Circle system utilized Preoxygenation: Pre-oxygenation with 100% oxygen Induction Type: IV induction Ventilation: Mask ventilation without difficulty LMA: LMA inserted LMA Size: 4.0 Number of attempts: 1 Placement Confirmation: positive ETCO2 Tube secured with: Tape Dental Injury: Teeth and Oropharynx as per pre-operative assessment

## 2023-10-21 NOTE — Op Note (Signed)
RADIOACTIVE SEED GUIDED RIGHT BREAST LUMPECTOMY  Procedure Note  Rachel Peterson 10/21/2023   Pre-op Diagnosis: RIGHT BREAST MASS     Post-op Diagnosis: same  Procedure(s): RADIOACTIVE SEED GUIDED RIGHT BREAST LUMPECTOMY  Surgeon(s): [name], MD  Anesthesia: General  Staff:  Circulator: Griffin Basil, RN; Maryan Rued, RN Scrub Person: Wardell Heath, CST  Estimated Blood Loss: Minimal               Specimens: sent to path  Indications: This is a 52 year old female was found to have a small mass in the upper outer quadrant of her right breast on mammography.  Biopsy was benign and felt to be discordant therefore complete surgical excision of the area with a radioactive seed guided right breast lumpectomy was recommended for complete histologic evaluation to rule out a malignancy  Procedure: The patient was brought to the operating identifies correct patient.  She was placed upon the operating table and general anesthesia was induced.  Her right breast was prepped and draped in usual sterile fashion.  Using the neoprobe I located the radioactive seed in the upper inner quadrant of the breast on the right side.  I anesthetized the skin with Marcaine and made an incision over this area with a scalpel.  I then dissected down to the breast tissue with electrocautery.  With the aid of the neoprobe I then dissected down to the signal from the radioactive seed staying widely around and going all the way down to the chest wall.  I then completed lumpectomy staying underneath the specimen.  Once the specimen was removed I marked all margins with paint.  An x-ray was performed on the specimen confirming the radioactive seed and previous biopsy clip were in the specimen.  The lumpectomy specimen was then sent to pathology for evaluation.  I achieved hemostasis with the cautery.  I anesthetized the lumpectomy cavity further with Marcaine.  I then closed the deep layers and a few 3-0  Vicryl sutures and then closed the subcutaneous tissue with 3-0 Vicryl sutures and ran the skin with a 4-0 running Monocryl.  Dermabond was then applied.  The patient tolerated the procedure well.  All the counts were correct at the end of the procedure.  The patient was then placed in a breast binder.  She was then extubated in the operating room and taken in a stable condition to the recovery room.          [name]   Date: 10/21/2023  Time: 9:50 AM

## 2023-10-21 NOTE — Discharge Instructions (Signed)
[facility] Office Phone Number [phone]  BREAST BIOPSY/ PARTIAL MASTECTOMY: POST OP INSTRUCTIONS  Always review your discharge instruction sheet given to you by the facility where your surgery was performed.  IF YOU HAVE DISABILITY OR FAMILY LEAVE FORMS, YOU MUST BRING THEM TO THE OFFICE FOR PROCESSING.  DO NOT GIVE THEM TO YOUR DOCTOR.  A prescription for pain medication may be given to you upon discharge.  Take your pain medication as prescribed, if needed.  If narcotic pain medicine is not needed, then you may take acetaminophen (Tylenol) or ibuprofen (Advil) as needed. Take your usually prescribed medications unless otherwise directed If you need a refill on your pain medication, please contact your pharmacy.  They will contact our office to request authorization.  Prescriptions will not be filled after 5pm or on week-ends. You should eat very light the first 24 hours after surgery, such as soup, crackers, pudding, etc.  Resume your normal diet the day after surgery. Most patients will experience some swelling and bruising in the breast.  Ice packs and a good support bra will help.  Swelling and bruising can take several days to resolve.  It is common to experience some constipation if taking pain medication after surgery.  Increasing fluid intake and taking a stool softener will usually help or prevent this problem from occurring.  A mild laxative (Milk of Magnesia or Miralax) should be taken according to package directions if there are no bowel movements after 48 hours. Unless discharge instructions indicate otherwise, you may remove your bandages 24-48 hours after surgery, and you may shower at that time.  You may have steri-strips (small skin tapes) in place directly over the incision.  These strips should be left on the skin for 7-10 days.  If your surgeon used skin glue on the incision, you may shower in 24 hours.  The glue will flake off over the next 2-3 weeks.  Any  sutures or staples will be removed at the office during your follow-up visit. ACTIVITIES:  You may resume regular daily activities (gradually increasing) beginning the next day.  Wearing a good support bra or sports bra minimizes pain and swelling.  You may have sexual intercourse when it is comfortable. You may drive when you no longer are taking prescription pain medication, you can comfortably wear a seatbelt, and you can safely maneuver your car and apply brakes. RETURN TO WORK:  ______________________________________________________________________________________ Bonita Quin should see your doctor in the office for a follow-up appointment approximately two weeks after your surgery.  Your doctor's nurse will typically make your follow-up appointment when she calls you with your pathology report.  Expect your pathology report 2-3 business days after your surgery.  You may call to check if you do not hear from [place] after three days. OTHER INSTRUCTIONS: __YOU MAY REMOVE THE BINDER AND SHOWER STARTING TOMORROW THEN WEAR WHAT EVER MAKES YOU THE MOST COMFORTABLE ICE PACK, TYLENOL ALSO FOR PAIN NO VIGOROUS ACTIVITY FOR ONE WEEK _____________________________________________________________________________________________ _____________________________________________________________________________________________________________________________________ _____________________________________________________________________________________________________________________________________ _____________________________________________________________________________________________________________________________________  WHEN TO CALL YOUR DOCTOR: Fever over 101.0 Nausea and/or vomiting. Extreme swelling or bruising. Continued bleeding from incision. Increased pain, redness, or drainage from the incision.  The clinic staff is available to answer your questions during regular business hours.  Please don't hesitate to  call and ask to speak to one of the nurses for clinical concerns.  If you have a medical emergency, go to the nearest emergency room or call 911.  A surgeon from [facility] is always  on call at the hospital.  For further questions, please visit [email or web address]

## 2023-10-21 NOTE — Interval H&P Note (Signed)
History and Physical Interval Note:no change in H and P  10/21/2023 7:06 AM  Rachel Peterson  has presented today for surgery, with the diagnosis of RIGHT BREAST MASS.  The various methods of treatment have been discussed with the patient and family. After consideration of risks, benefits and other options for treatment, the patient has consented to  Procedure(s) with comments: RADIOACTIVE SEED GUIDED RIGHT BREAST LUMPECTOMY (Right) - LMA as a surgical intervention.  The patient's history has been reviewed, patient examined, no change in status, stable for surgery.  I have reviewed the patient's chart and labs.  Questions were answered to the patient's satisfaction.     [name]

## 2023-10-21 NOTE — Transfer of Care (Signed)
Immediate Anesthesia Transfer of Care Note  Patient: Rachel Peterson  Procedure(s) Performed: RADIOACTIVE SEED GUIDED RIGHT BREAST LUMPECTOMY (Right: Breast)  Patient Location: PACU  Anesthesia Type:General  Level of Consciousness: awake, alert , and oriented  Airway & Oxygen Therapy: Patient Spontanous Breathing and Patient connected to face mask oxygen  Post-op Assessment: Report given to RN and Post -op Vital signs reviewed and stable  Post vital signs: Reviewed and stable  Last Vitals:  Vitals Value Taken Time  BP 92/52 10/21/23 1001  Temp    Pulse 67 10/21/23 1003  Resp 14 10/21/23 1003  SpO2 99 % 10/21/23 1003  Vitals shown include unfiled device data.  Last Pain:  Vitals:   10/21/23 0709  TempSrc: Oral  PainSc: 0-No pain      Patients Stated Pain Goal: 7 (10/21/23 0709)  Complications: No notable events documented.

## 2023-10-21 NOTE — Anesthesia Postprocedure Evaluation (Signed)
Anesthesia Post Note  Patient: Rachel Peterson  Procedure(s) Performed: RADIOACTIVE SEED GUIDED RIGHT BREAST LUMPECTOMY (Right: Breast)     Patient location during evaluation: PACU Anesthesia Type: General Level of consciousness: awake and alert Pain management: pain level controlled Vital Signs Assessment: post-procedure vital signs reviewed and stable Respiratory status: spontaneous breathing, nonlabored ventilation, respiratory function stable and patient connected to nasal cannula oxygen Cardiovascular status: blood pressure returned to baseline and stable Postop Assessment: no apparent nausea or vomiting Anesthetic complications: no   No notable events documented.  Last Vitals:  Vitals:   10/21/23 1030 10/21/23 1050  BP: 112/78 (!) 143/90  Pulse: (!) 56   Resp: (!) 22 17  Temp:  36.6 C  SpO2: 97% 99%    Last Pain:  Vitals:   10/21/23 1050  TempSrc:   PainSc: 0-No pain                 Trevor Iha

## 2023-10-22 ENCOUNTER — Encounter (HOSPITAL_BASED_OUTPATIENT_CLINIC_OR_DEPARTMENT_OTHER): Payer: Self-pay | Admitting: Surgery

## 2023-10-22 LAB — SURGICAL PATHOLOGY
